# Patient Record
Sex: Female | Born: 1970 | Race: Black or African American | Hispanic: No | Marital: Married | Smoking: Never smoker
Health system: Southern US, Community
[De-identification: ages and names within clinical notes are randomized; demographics above are authoritative.]

## PROBLEM LIST (undated history)

## (undated) DIAGNOSIS — G43909 Migraine, unspecified, not intractable, without status migrainosus: Secondary | ICD-10-CM

## (undated) DIAGNOSIS — R569 Unspecified convulsions: Secondary | ICD-10-CM

## (undated) HISTORY — DX: Unspecified convulsions: R56.9

---

## 1997-07-01 HISTORY — PX: CYST EXCISION: SHX5701

## 2017-08-12 ENCOUNTER — Encounter (HOSPITAL_COMMUNITY): Payer: Self-pay | Admitting: Emergency Medicine

## 2017-08-12 ENCOUNTER — Ambulatory Visit (HOSPITAL_COMMUNITY)
Admission: EM | Admit: 2017-08-12 | Discharge: 2017-08-12 | Disposition: A | Discharge status: Home / Self Care | Payer: Medicaid Other | Attending: Family Medicine | Admitting: Family Medicine

## 2017-08-12 DIAGNOSIS — G43909 Migraine, unspecified, not intractable, without status migrainosus: Secondary | ICD-10-CM

## 2017-08-12 DIAGNOSIS — Z760 Encounter for issue of repeat prescription: Secondary | ICD-10-CM | POA: Diagnosis not present

## 2017-08-12 HISTORY — DX: Migraine, unspecified, not intractable, without status migrainosus: G43.909

## 2017-08-12 MED ORDER — LEVETIRACETAM 250 MG PO TABS: 250.0000 mg | ORAL_TABLET | Freq: Two times a day (BID) | ORAL | 0 refills | Status: DC

## 2017-08-12 NOTE — Discharge Instructions (Signed)
Keppra refilled for 40 days, follow up PCP as scheduled for further management and evaluation needed.

## 2017-08-12 NOTE — ED Triage Notes (Signed)
PT reports she takes a seizure med for her migraines. She recently moved from [place] and she is running out of this med. She has been using it sparingly and has a headache today as a result.   Keppra 250 mg BID

## 2017-08-12 NOTE — ED Provider Notes (Signed)
[facility]    CSN: [id number] Arrival date & time: 08/12/17  0957     History   Chief Complaint Chief Complaint  Patient presents with  . Headache  . Medication Refill    HPI Carol Wiggins is a 47 y.o. female.   47 year old female with history of migraines and seizures comes in for medication refill. States that she moved to [place] from [place] about 2 months ago, she has not been able to find a PCP due to transferring of insurance. She is running out of her Keppra medication, and has been taking it once a day instead of twice a day starting last week. States now with right sided headache that is dull in nature. States dull right sided headache is normal for her prior to starting Keppra, and that Keppra was able to control the headache without problems. She started Keppra 03/2017, which was when her last seizure episode was, she has not had an episode on Keppra. She now has a PCP appointment scheduled for the week of 3/15 and will await them to refer her to neurologist. She is on her last 2 pills of Keppra.       Past Medical History:  Diagnosis Date  . Migraines     There are no active problems to display for this patient.   History reviewed. No pertinent surgical history.  OB History    No data available       Home Medications    Prior to Admission medications   Medication Sig Start Date End Date Taking? Authorizing Provider  levETIRAcetam (KEPPRA) 250 MG tablet Take 1 tablet (250 mg total) by mouth 2 (two) times daily. 08/12/17 09/21/17  Belinda Fisher, PA-C    Family History No family history on file.  Social History Social History   Tobacco Use  . Smoking status: Not on file  Substance Use Topics  . Alcohol use: Not on file  . Drug use: Not on file     Allergies   Patient has no known allergies.   Review of Systems Review of Systems  Reason unable to perform ROS: See HPI as above.     Physical Exam Triage Vital Signs ED  Triage Vitals  Enc Vitals Group     BP 08/12/17 1011 123/79     Pulse Rate 08/12/17 1011 (!) 56     Resp 08/12/17 1011 16     Temp 08/12/17 1011 97.7 F (36.5 C)     Temp Source 08/12/17 1011 Oral     SpO2 08/12/17 1011 99 %     Weight 08/12/17 1010 160 lb (72.6 kg)     Height 08/12/17 1010 5\' 2"  (1.575 m)     Head Circumference --      Peak Flow --      Pain Score 08/12/17 1010 4     Pain Loc --      Pain Edu? --      Excl. in GC? --    No data found.  Updated Vital Signs BP 123/79   Pulse (!) 56   Temp 97.7 F (36.5 C) (Oral)   Resp 16   Ht 5\' 2"  (1.575 m)   Wt 160 lb (72.6 kg)   LMP 07/26/2017   SpO2 99%   BMI 29.26 kg/m   Physical Exam  Constitutional: She is oriented to person, place, and time. She appears well-developed and well-nourished. No distress.  HENT:  Head: Normocephalic and atraumatic.  Eyes:  Conjunctivae and EOM are normal. Pupils are equal, round, and reactive to light.  Neck: Normal range of motion. Neck supple.  Cardiovascular: Normal rate, regular rhythm and normal heart sounds. Exam reveals no gallop and no friction rub.  No murmur heard. Pulmonary/Chest: Effort normal and breath sounds normal. No stridor. No respiratory distress. She has no wheezes. She has no rales.  Neurological: She is alert and oriented to person, place, and time.   UC Treatments / Results  Labs (all labs ordered are listed, but only abnormal results are displayed) Labs Reviewed - No data to display  EKG  EKG Interpretation None       Radiology No results found.  Procedures Procedures (including critical care time)  Medications Ordered in UC Medications - No data to display   Initial Impression / Assessment and Plan / UC Course  I have reviewed the triage vital signs and the nursing notes.  Pertinent labs & imaging results that were available during my care of the patient were reviewed by me and considered in my medical decision making (see chart for  details).    47 year old female with history of seizures or migraines comes in for medication refill of seizure medication. [place], and has not been able to establish PCP care due to transferring insurance. Has been on Keppra 250 mg twice a day for the past 4 months without seizures. Now reduced to one a day to preserve medication. Right dull headache that is normal to patient prior to starting Keppra, and states she just needs her regular dose to resolve the headache. She has a PCP appointment a week of March 15 not that her insurance has been processed. Will refill medication for 40 days, patient to follow-up with PCP as scheduled for further evaluation and management needed.  Return precautions given. Patient expresses understanding and agrees to plan.  Final Clinical Impressions(s) / UC Diagnoses   Final diagnoses:  Medication refill    ED Discharge Orders        Ordered    levETIRAcetam (KEPPRA) 250 MG tablet  2 times daily     08/12/17 1019        Belinda FisherYu, Amy V, [place]-C 08/12/17 1046

## 2018-04-14 ENCOUNTER — Other Ambulatory Visit: Payer: Self-pay | Admitting: Family Medicine

## 2018-04-14 ENCOUNTER — Ambulatory Visit
Admission: RE | Admit: 2018-04-14 | Discharge: 2018-04-14 | Disposition: A | Discharge status: Home / Self Care | Payer: Medicaid Other | Source: Ambulatory Visit | Attending: Family Medicine | Admitting: Family Medicine

## 2018-04-14 DIAGNOSIS — Z117 Encounter for testing for latent tuberculosis infection: Secondary | ICD-10-CM

## 2018-05-07 ENCOUNTER — Other Ambulatory Visit: Payer: Self-pay | Admitting: Family Medicine

## 2018-05-07 ENCOUNTER — Other Ambulatory Visit (HOSPITAL_COMMUNITY)
Admission: RE | Admit: 2018-05-07 | Discharge: 2018-05-07 | Disposition: A | Discharge status: Home / Self Care | Payer: Medicaid Other | Source: Ambulatory Visit | Attending: Family Medicine | Admitting: Family Medicine

## 2018-05-07 DIAGNOSIS — Z01411 Encounter for gynecological examination (general) (routine) with abnormal findings: Secondary | ICD-10-CM | POA: Diagnosis not present

## 2018-05-13 LAB — CYTOLOGY - PAP
Diagnosis: NEGATIVE
HPV (WINDOPATH): NOT DETECTED

## 2018-08-12 ENCOUNTER — Other Ambulatory Visit (HOSPITAL_COMMUNITY)
Admission: RE | Admit: 2018-08-12 | Discharge: 2018-08-12 | Disposition: A | Discharge status: Home / Self Care | Payer: Medicaid Other | Source: Ambulatory Visit | Attending: Family Medicine | Admitting: Family Medicine

## 2018-08-12 ENCOUNTER — Other Ambulatory Visit: Payer: Self-pay

## 2018-08-12 DIAGNOSIS — N898 Other specified noninflammatory disorders of vagina: Secondary | ICD-10-CM | POA: Diagnosis present

## 2018-08-15 LAB — URINE CYTOLOGY ANCILLARY ONLY
CHLAMYDIA, DNA PROBE: NEGATIVE
Candida vaginitis: NEGATIVE
Neisseria Gonorrhea: NEGATIVE
TRICH (WINDOWPATH): NEGATIVE

## 2018-11-30 ENCOUNTER — Telehealth: Payer: Self-pay | Admitting: Diagnostic Neuroimaging

## 2018-11-30 NOTE — Telephone Encounter (Signed)
°  Due to current COVID 19 pandemic, our office is severely reducing in office visits until further notice, in order to minimize the risk to our patients and healthcare providers.    Called patient and scheduled a virtual visit with Dr. Marjory Lies for 6/10. Patient verbalized understanding of the [email or web address] process and I have sent an e-mail to [email or web address] with link and instructions as well as my name and our office number. Patient understands that they will receive a call from RN to update chart.   Pt understands that although there may be some limitations with this type of visit, we will take all precautions to reduce any security or privacy concerns.  Pt understands that this will be treated like an in office visit and we will file with pt's insurance, and there may be a patient responsible charge related to this service.

## 2018-12-08 ENCOUNTER — Encounter: Payer: Self-pay | Admitting: Diagnostic Neuroimaging

## 2018-12-08 NOTE — Telephone Encounter (Signed)
Called patient and LVM requesting call back to update EMR.  

## 2018-12-09 ENCOUNTER — Ambulatory Visit (INDEPENDENT_AMBULATORY_CARE_PROVIDER_SITE_OTHER): Payer: Medicaid Other | Admitting: Diagnostic Neuroimaging

## 2018-12-09 ENCOUNTER — Other Ambulatory Visit: Payer: Self-pay

## 2018-12-09 ENCOUNTER — Encounter: Payer: Self-pay | Admitting: Diagnostic Neuroimaging

## 2018-12-09 DIAGNOSIS — G40209 Localization-related (focal) (partial) symptomatic epilepsy and epileptic syndromes with complex partial seizures, not intractable, without status epilepticus: Secondary | ICD-10-CM | POA: Diagnosis not present

## 2018-12-09 DIAGNOSIS — G43109 Migraine with aura, not intractable, without status migrainosus: Secondary | ICD-10-CM | POA: Diagnosis not present

## 2018-12-09 MED ORDER — TOPIRAMATE 50 MG PO TABS: 50.0000 mg | ORAL_TABLET | Freq: Two times a day (BID) | ORAL | 12 refills | Status: DC

## 2018-12-09 MED ORDER — LEVETIRACETAM 250 MG PO TABS: 250.0000 mg | ORAL_TABLET | Freq: Two times a day (BID) | ORAL | 4 refills | Status: DC

## 2018-12-09 MED ORDER — RIZATRIPTAN BENZOATE 10 MG PO TBDP: 10.0000 mg | ORAL_TABLET | ORAL | 11 refills | Status: DC | PRN

## 2018-12-09 NOTE — Progress Notes (Signed)
[facility]  PATIENT: Carol Wiggins DOB: 1971/01/12  REFERRING CLINICIAN: Clayburn Pert HISTORY FROM: patient  REASON FOR VISIT: new consult    HISTORICAL  CHIEF COMPLAINT:  Chief Complaint  Patient presents with  . Seizures  . Headache    HISTORY OF PRESENT ILLNESS:   48 year old female here for evaluation of seizures and headaches.  Patient was living in [place] in 2018 and had new onset of zoning out spell while driving.  She was able to pull the car over.  No convulsions or tongue biting.  She had several of these episodes and went to neurology for evaluation.  MRI of the brain was unremarkable.  Ambulatory EEG apparently showed an abnormality and therefore patient was started on levetiracetam 200 mg twice a day.  Since that time no further events.  Patient also is had intermittent headaches since 2009.  She describes frontal throbbing severe headaches with nausea sensitivity to light and sound.  Now headaches have worsened in the last few weeks.  Patient has been more stress lately.  She is tried over-the-counter Excedrin Migraine with mild relief.  She not tried migraine prevention or rescue medications by prescription yet.   REVIEW OF SYSTEMS: Full 14 system review of systems performed and negative with exception of: As per HPI.  ALLERGIES: No Known Allergies  HOME MEDICATIONS: Outpatient Medications Prior to Visit  Medication Sig Dispense Refill  . levETIRAcetam (KEPPRA) 250 MG tablet Take 1 tablet (250 mg total) by mouth 2 (two) times daily. 80 tablet 0   No facility-administered medications prior to visit.     PAST MEDICAL HISTORY: Past Medical History:  Diagnosis Date  . Migraines   . Seizures ([place])     PAST SURGICAL HISTORY: No past surgical history on file.  FAMILY HISTORY: No family history on file.  SOCIAL HISTORY: Social History   Socioeconomic History  . Marital status: Married    Spouse name: Not on file  . Number of  children: Not on file  . Years of education: Not on file  . Highest education level: Not on file  Occupational History  . Not on file  Social Needs  . Financial resource strain: Not on file  . Food insecurity:    Worry: Not on file    Inability: Not on file  . Transportation needs:    Medical: Not on file    Non-medical: Not on file  Tobacco Use  . Smoking status: Not on file  Substance and Sexual Activity  . Alcohol use: Not on file  . Drug use: Not on file  . Sexual activity: Not on file  Lifestyle  . Physical activity:    Days per week: Not on file    Minutes per session: Not on file  . Stress: Not on file  Relationships  . Social connections:    Talks on phone: Not on file    Gets together: Not on file    Attends religious service: Not on file    Active member of club or organization: Not on file    Attends meetings of clubs or organizations: Not on file    Relationship status: Not on file  . Intimate partner violence:    Fear of current or ex partner: Not on file    Emotionally abused: Not on file    Physically abused: Not on file    Forced sexual activity: Not on file  Other Topics Concern  . Not on file  Social History Narrative  .  Not on file     PHYSICAL EXAM   VIDEO EXAM  GENERAL EXAM/CONSTITUTIONAL:  Vitals: There were no vitals filed for this visit.  There is no height or weight on file to calculate BMI. Wt Readings from Last 3 Encounters:  08/12/17 160 lb (72.6 kg)     Patient is in no distress; well developed, nourished and groomed; neck is supple   NEUROLOGIC: MENTAL STATUS:  No flowsheet data found.  awake, alert, oriented to person, place and time  recent and remote memory intact  normal attention and concentration  language fluent, comprehension intact, naming intact  fund of knowledge appropriate  CRANIAL NERVE:   2nd, 3rd, 4th, 6th - visual fields full to confrontation, extraocular muscles intact, no nystagmus  5th -  facial sensation symmetric  7th - facial strength symmetric  8th - hearing intact  11th - shoulder shrug symmetric  12th - tongue protrusion midline  MOTOR:   NO TREMOR; NO DRIFT IN BUE  SENSORY:   normal and symmetric to light touch  COORDINATION:   fine finger movements normal     DIAGNOSTIC DATA (LABS, IMAGING, TESTING) - I reviewed patient records, labs, notes, testing and imaging myself where available.  No results found for: WBC, HGB, HCT, MCV, PLT No results found for: NA, K, CL, CO2, GLUCOSE, BUN, CREATININE, CALCIUM, PROT, ALBUMIN, AST, ALT, ALKPHOS, BILITOT, GFRNONAA, GFRAA No results found for: CHOL, HDL, LDLCALC, LDLDIRECT, TRIG, CHOLHDL No results found for: [id number] No results found for: VITAMINB12 No results found for: TSH   MRI brain --> normal per patient  EEG --> "epilepsy; 60%" per patient    ASSESSMENT AND PLAN  48 y.o. year old female here with complex partial seizures and migraine with aura.   Dx:  1. Partial symptomatic epilepsy with complex partial seizures, not intractable, without status epilepticus (HCC)   2. Migraine with aura and without status migrainosus, not intractable     Virtual Visit via Video Note  I connected with Carol Wiggins on 12/09/18 at  2:30 PM EDT by a video enabled telemedicine application and verified that I am speaking with the correct person using two identifiers.  Location: Patient: home Provider: office   I discussed the limitations of evaluation and management by telemedicine and the availability of in person appointments. The patient expressed understanding and agreed to proceed.  I discussed the assessment and treatment plan with the patient. The patient was provided an opportunity to ask questions and all were answered. The patient agreed with the plan and demonstrated an understanding of the instructions.   The patient was advised to call back or seek an in-person evaluation if the symptoms  worsen or if the condition fails to improve as anticipated.  I provided 30 minutes of non-face-to-face time during this encounter.    PLAN:  SEIZURE DISORDER (complex partial sz; zone out spells; last seizure in 2018) - continue levetiracetam 250mg  twice a day    MIGRAINE WITH AURA - MIGRAINE PREVENTION --> start topiramate 50mg  at bedtime; after 1 week increase to twice a day; drink plenty of water  - MIGRAINE RESCUE --> rizatriptan 10mg  as needed for breakthrough headache; may repeat x 1 after 2 hours; max 2 tabs per day or 8 per month  Meds ordered this encounter  Medications  . topiramate (TOPAMAX) 50 MG tablet    Sig: Take 1 tablet (50 mg total) by mouth 2 (two) times daily.    Dispense:  60 tablet    Refill:  12  . rizatriptan (MAXALT-MLT) 10 MG disintegrating tablet    Sig: Take 1 tablet (10 mg total) by mouth as needed for migraine. May repeat in 2 hours if needed    Dispense:  9 tablet    Refill:  11  . levETIRAcetam (KEPPRA) 250 MG tablet    Sig: Take 1 tablet (250 mg total) by mouth 2 (two) times daily.    Dispense:  180 tablet    Refill:  4   Return in about 3 months (around 03/11/2019).    Suanne MarkerVIKRAM R. Issiac Jamar, MD 12/09/2018, 3:00 PM Certified in Neurology, Neurophysiology and Neuroimaging  [facility] [address], [address] [place], [place] [address] [phone]

## 2019-02-26 IMAGING — DX DG CHEST 2V
2 series · 2 of 2 positions shown · non-contrast
Comparison: None.

CLINICAL DATA: History of latent TB.

EXAM:
CHEST - 2 VIEW

[dg chest 2 view (1 of 2)]
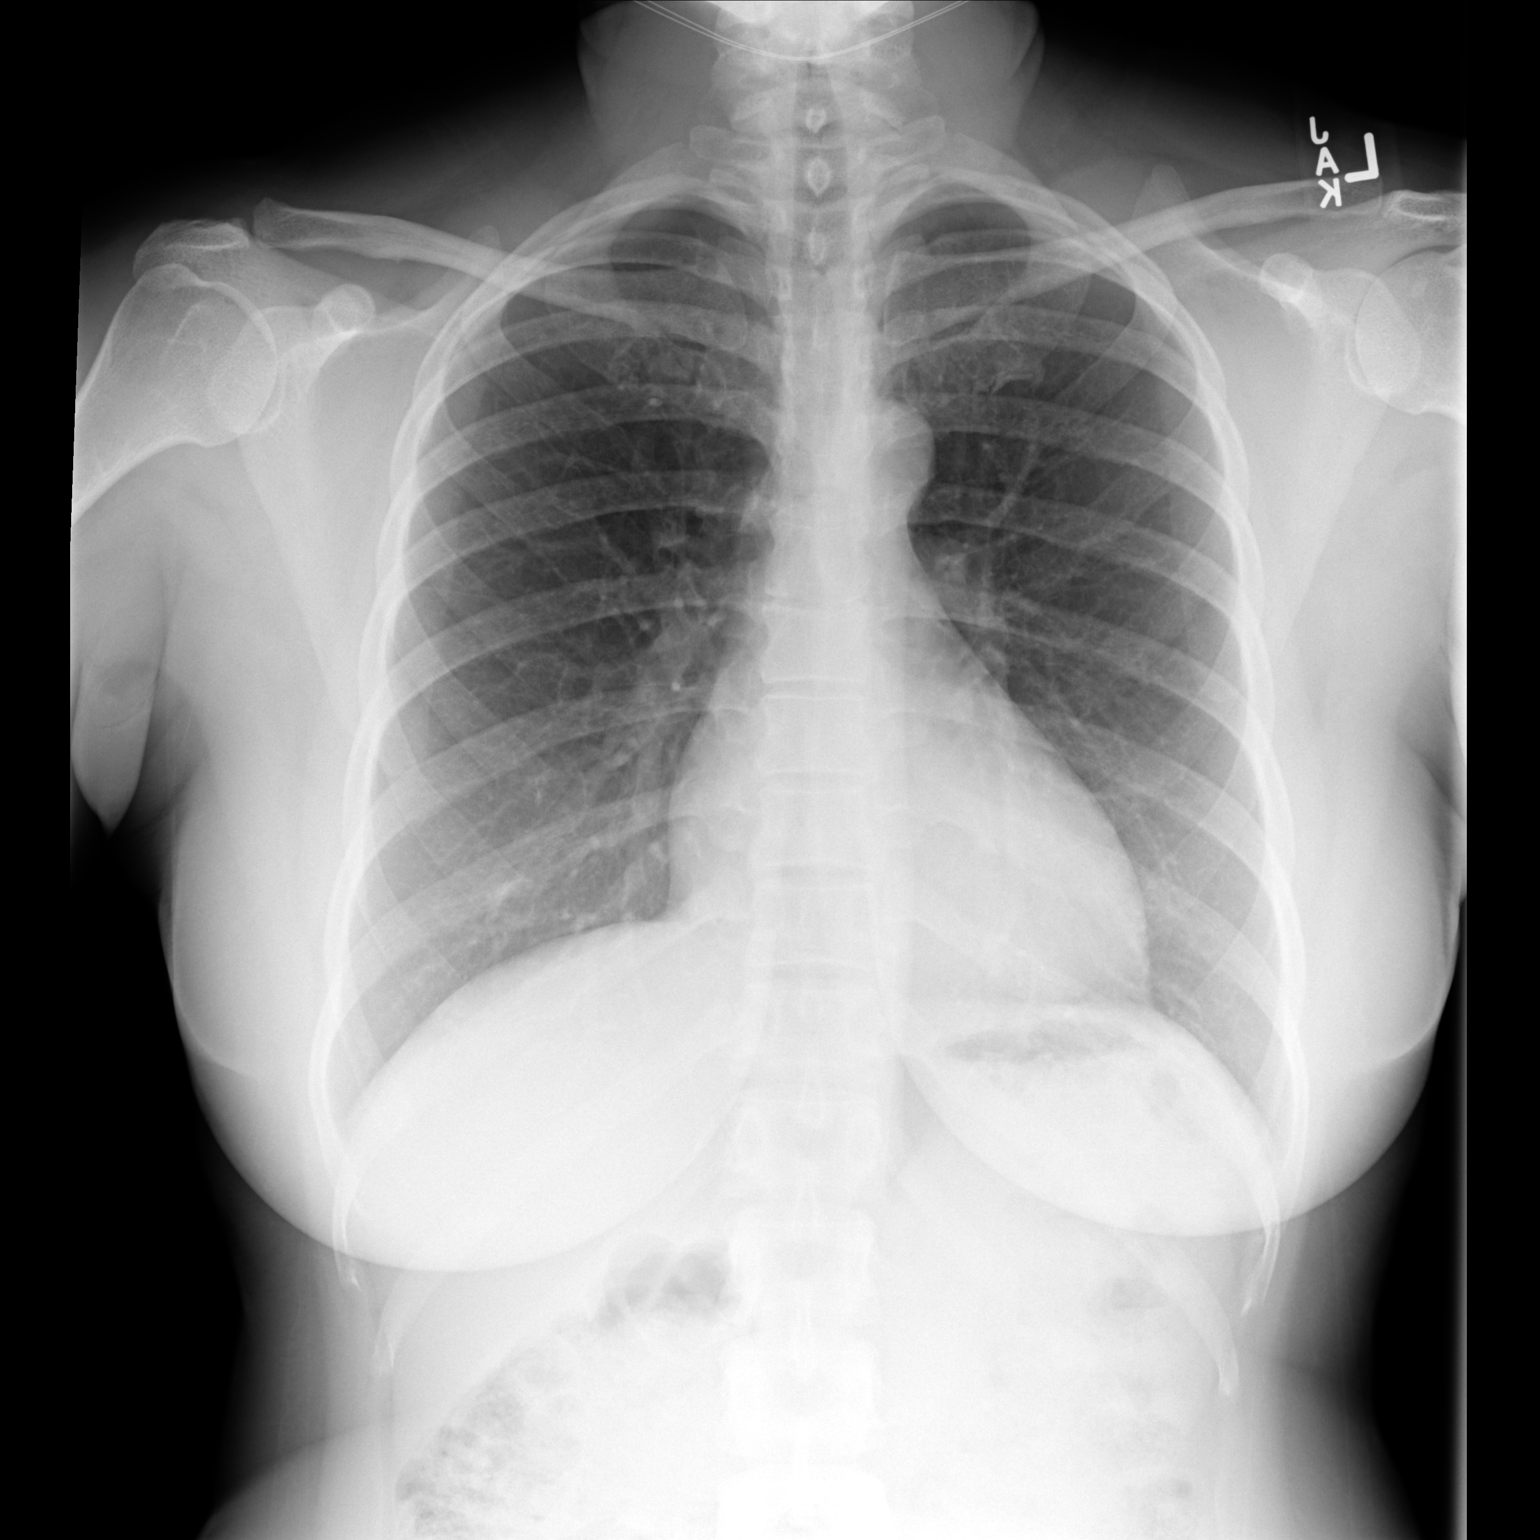

[dg chest 2 view (2 of 2)]
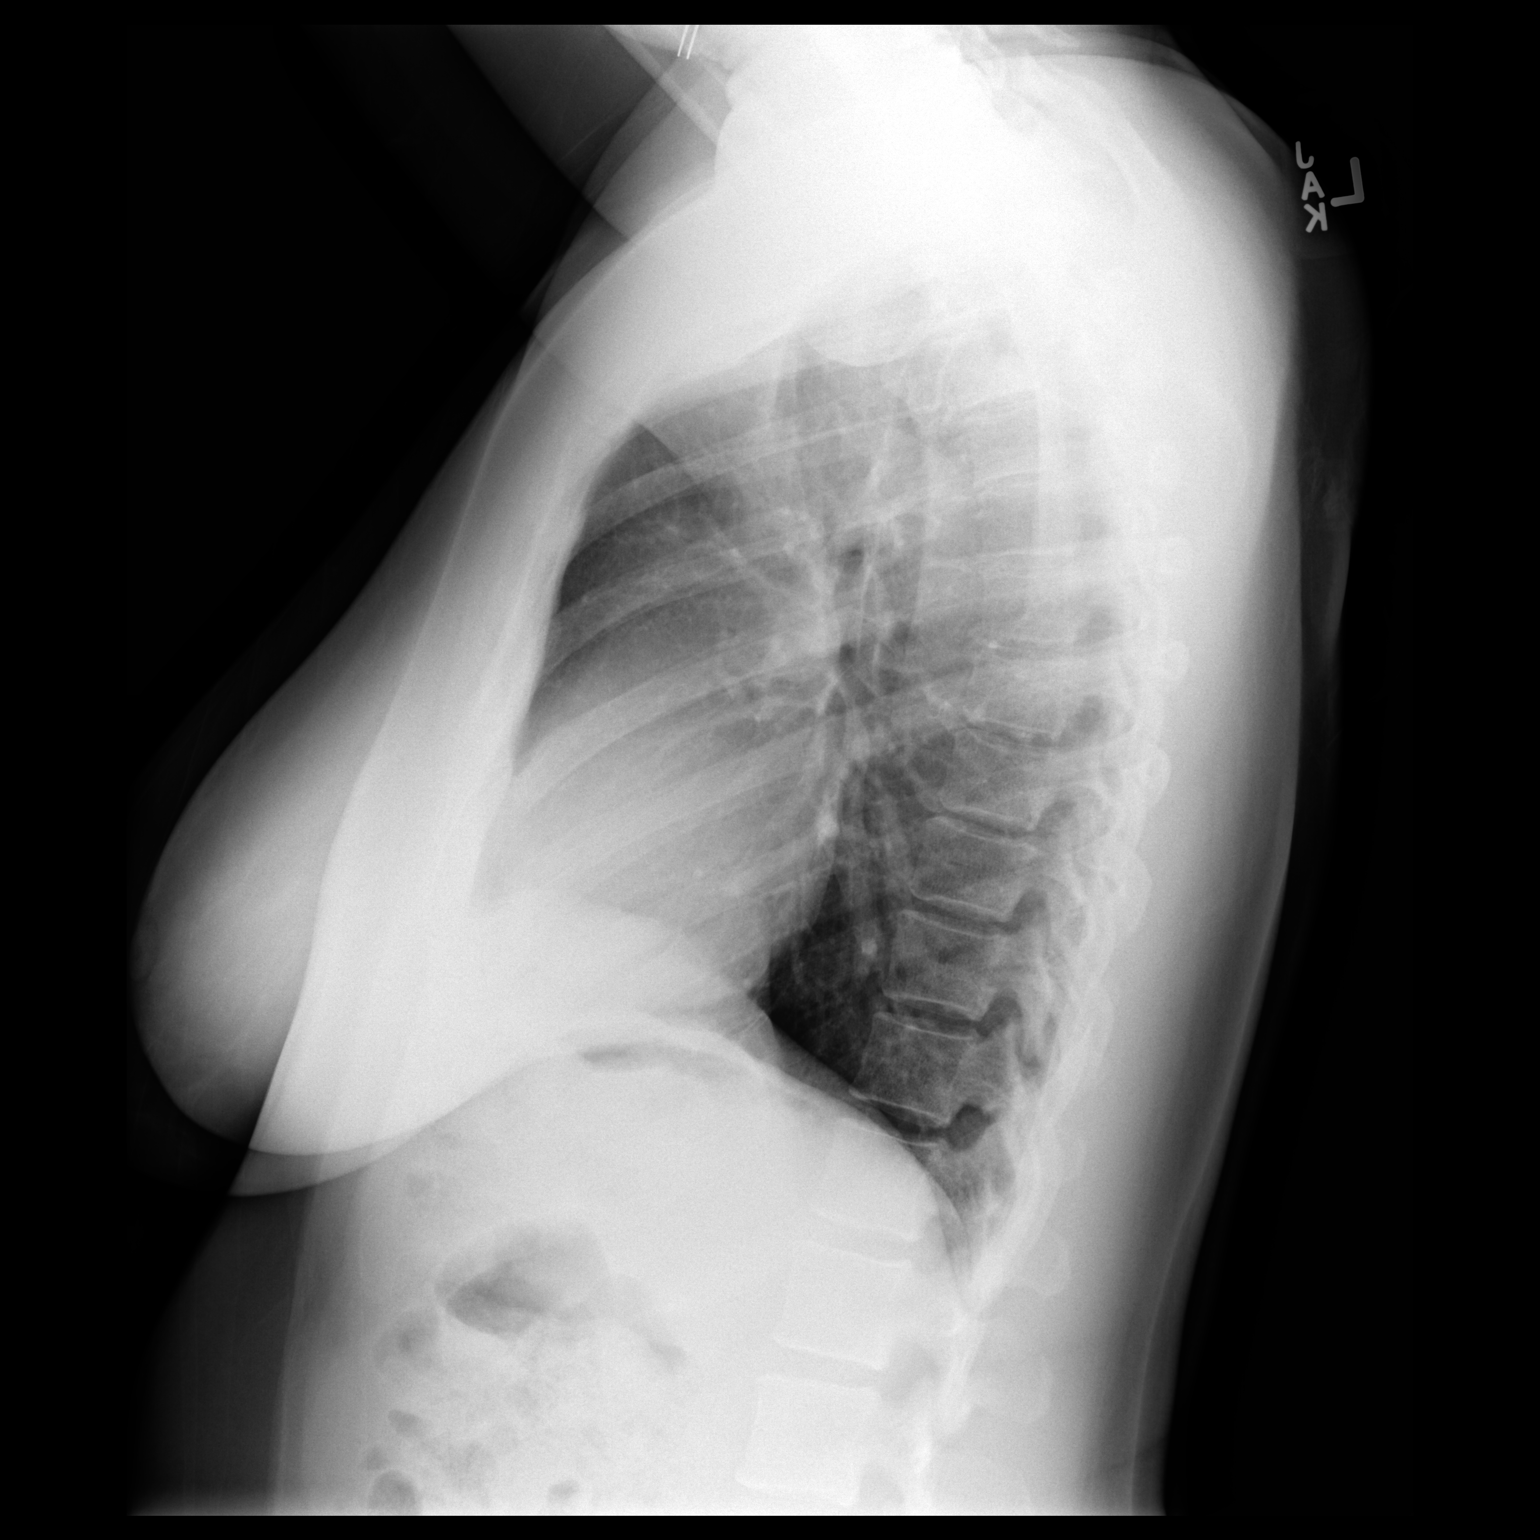

[2 of 2 positions shown; findings below may reference images not displayed]

FINDINGS: The heart size and mediastinal contours are within normal limits.
Both lungs are clear. The visualized skeletal structures are
unremarkable.
IMPRESSION: No active cardiopulmonary disease.

## 2019-04-09 ENCOUNTER — Other Ambulatory Visit: Payer: Self-pay

## 2019-04-09 DIAGNOSIS — Z20822 Contact with and (suspected) exposure to covid-19: Secondary | ICD-10-CM

## 2019-04-10 LAB — NOVEL CORONAVIRUS, NAA: SARS-CoV-2, NAA: NOT DETECTED

## 2019-04-20 ENCOUNTER — Telehealth: Payer: Self-pay | Admitting: Diagnostic Neuroimaging

## 2019-04-20 ENCOUNTER — Telehealth: Payer: Self-pay | Admitting: General Practice

## 2019-04-20 NOTE — Telephone Encounter (Signed)
Pt is needing a refill on her levETIRAcetam (KEPPRA) 250 MG tablet, her rizatriptan (MAXALT-MLT) 10 MG disintegrating tablet and her topiramate (TOPAMAX) 50 MG tablet sent to [facility]

## 2019-04-20 NOTE — Telephone Encounter (Signed)
Called patient and advised her Dr Leta Baptist gave refills for all three medicines. I advised she needs follow up as well. She stated pharmacy told her there were no refills, and she stated she needs FU in Oct because she'll be out of town the whole month of Nov. We scheduled FU, and I advised will [facility] and get [facility]ills straight. She  verbalized understanding, appreciation. I called [facility], spoke with Ailene Ravel who confirmed they have refills on file. She will fill and notify patient.

## 2019-04-20 NOTE — Telephone Encounter (Signed)
Negative COVID results given. Patient results "NOT Detected." Caller expressed understanding. ° °

## 2019-04-26 ENCOUNTER — Encounter: Payer: Self-pay | Admitting: Diagnostic Neuroimaging

## 2019-04-26 ENCOUNTER — Ambulatory Visit: Payer: Medicaid Other | Admitting: Diagnostic Neuroimaging

## 2019-04-26 ENCOUNTER — Other Ambulatory Visit: Payer: Self-pay

## 2019-04-26 VITALS — BP 131/91 | HR 77 | Temp 97.3°F | Ht 62.0 in | Wt 167.0 lb

## 2019-04-26 DIAGNOSIS — G43109 Migraine with aura, not intractable, without status migrainosus: Secondary | ICD-10-CM

## 2019-04-26 DIAGNOSIS — G40209 Localization-related (focal) (partial) symptomatic epilepsy and epileptic syndromes with complex partial seizures, not intractable, without status epilepticus: Secondary | ICD-10-CM

## 2019-04-26 MED ORDER — RIZATRIPTAN BENZOATE 10 MG PO TBDP: 10.0000 mg | ORAL_TABLET | ORAL | 11 refills | Status: AC | PRN

## 2019-04-26 MED ORDER — LEVETIRACETAM 250 MG PO TABS: 250.0000 mg | ORAL_TABLET | Freq: Two times a day (BID) | ORAL | 4 refills | Status: AC

## 2019-04-26 MED ORDER — TOPIRAMATE 50 MG PO TABS: 50.0000 mg | ORAL_TABLET | Freq: Two times a day (BID) | ORAL | 4 refills | Status: AC

## 2019-04-26 NOTE — Progress Notes (Signed)
[facility]  PATIENT: Carol Wiggins DOB: [birth year]  REFERRING CLINICIAN: Lowella Bandy HISTORY FROM: patient  REASON FOR VISIT: follow up    HISTORICAL  CHIEF COMPLAINT:  Chief Complaint  Patient presents with  . Epilepsy    rm 7, 3 month FU, "no seizures; headaches not as bad as they used to be"    HISTORY OF PRESENT ILLNESS:   UPDATE (04/26/19, VRP): Since last visit, doing well. Symptoms are stable. No alleviating or aggravating factors. Tolerating meds. No seizures. Having HA 1 per week (for some reason did not refill the HA meds).   PRIOR HPI (12/09/18): 48 year old female here for evaluation of seizures and headaches.  Patient was living in [place] in 2018 and had new onset of zoning out spell while driving.  She was able to pull the car over.  No convulsions or tongue biting.  She had several of these episodes and went to neurology for evaluation.  MRI of the brain was unremarkable.  Ambulatory EEG apparently showed an abnormality and therefore patient was started on levetiracetam 200 mg twice a day.  Since that time no further events.  Patient also is had intermittent headaches since 2009.  She describes frontal throbbing severe headaches with nausea sensitivity to light and sound.  Now headaches have worsened in the last few weeks.  Patient has been more stress lately.  She is tried over-the-counter Excedrin Migraine with mild relief.  She not tried migraine prevention or rescue medications by prescription yet.   REVIEW OF SYSTEMS: Full 14 system review of systems performed and negative with exception of: as per HPI.   ALLERGIES: No Known Allergies  HOME MEDICATIONS: Outpatient Medications Prior to Visit  Medication Sig Dispense Refill  . levETIRAcetam (KEPPRA) 250 MG tablet Take 1 tablet (250 mg total) by mouth 2 (two) times daily. 180 tablet 4  . rizatriptan (MAXALT-MLT) 10 MG disintegrating tablet Take 1 tablet (10 mg total) by mouth as needed  for migraine. May repeat in 2 hours if needed 9 tablet 11  . topiramate (TOPAMAX) 50 MG tablet Take 1 tablet (50 mg total) by mouth 2 (two) times daily. 60 tablet 12   No facility-administered medications prior to visit.     PAST MEDICAL HISTORY: Past Medical History:  Diagnosis Date  . Migraines   . Seizures (HCC)     PAST SURGICAL HISTORY: Past Surgical History:  Procedure Laterality Date  . CESAREAN SECTION  2009  . CYST EXCISION  1999   ovarian    FAMILY HISTORY: No family history on file.  SOCIAL HISTORY: Social History   Socioeconomic History  . Marital status: Married    Spouse name: Not on file  . Number of children: 2  . Years of education: Not on file  . Highest education level: Not on file  Occupational History    Comment: weekends only, home care  Social Needs  . Financial resource strain: Not on file  . Food insecurity    Worry: Not on file    Inability: Not on file  . Transportation needs    Medical: Not on file    Non-medical: Not on file  Tobacco Use  . Smoking status: Never Smoker  . Smokeless tobacco: Never Used  Substance and Sexual Activity  . Alcohol use: Yes    Comment: rarely  . Drug use: Never  . Sexual activity: Not on file  Lifestyle  . Physical activity    Days per week: Not on file  Minutes per session: Not on file  . Stress: Not on file  Relationships  . Social Herbalist on phone: Not on file    Gets together: Not on file    Attends religious service: Not on file    Active member of club or organization: Not on file    Attends meetings of clubs or organizations: Not on file    Relationship status: Not on file  . Intimate partner violence    Fear of current or ex partner: Not on file    Emotionally abused: Not on file    Physically abused: Not on file    Forced sexual activity: Not on file  Other Topics Concern  . Not on file  Social History Narrative   From [place], Lives with family     PHYSICAL EXAM   GENERAL EXAM/CONSTITUTIONAL: Vitals:  Vitals:   04/26/19 1312  BP: (!) 131/91  Pulse: 77  Temp: (!) 97.3 F (36.3 C)  Weight: 167 lb (75.8 kg)  Height: 5\' 2"  (1.575 m)     Body mass index is 30.54 kg/m. Wt Readings from Last 3 Encounters:  04/26/19 167 lb (75.8 kg)  08/12/17 160 lb (72.6 kg)     Patient is in no distress; well developed, nourished and groomed; neck is supple  CARDIOVASCULAR:  Examination of carotid arteries is normal; no carotid bruits  Regular rate and rhythm, no murmurs  Examination of peripheral vascular system by observation and palpation is normal  EYES:  Ophthalmoscopic exam of optic discs and posterior segments is normal; no papilledema or hemorrhages  No exam data present  MUSCULOSKELETAL:  Gait, strength, tone, movements noted in Neurologic exam below  NEUROLOGIC: MENTAL STATUS:  No flowsheet data found.  awake, alert, oriented to person, place and time  recent and remote memory intact  normal attention and concentration  language fluent, comprehension intact, naming intact  fund of knowledge appropriate  CRANIAL NERVE:   2nd - no papilledema on fundoscopic exam  2nd, 3rd, 4th, 6th - pupils equal and reactive to light, visual fields full to confrontation, extraocular muscles intact, no nystagmus  5th - facial sensation symmetric  7th - facial strength symmetric  8th - hearing intact  9th - palate elevates symmetrically, uvula midline  11th - shoulder shrug symmetric  12th - tongue protrusion midline  MOTOR:   normal bulk and tone, full strength in the BUE, BLE  SENSORY:   normal and symmetric to light touch, pinprick, temperature, vibration  COORDINATION:   finger-nose-finger, fine finger movements normal  REFLEXES:   deep tendon reflexes present and symmetric  GAIT/STATION:   narrow based gait; able to walk on toes, heels and tandem; romberg is negative     DIAGNOSTIC DATA (LABS, IMAGING,  TESTING) - I reviewed patient records, labs, notes, testing and imaging myself where available.  No results found for: WBC, HGB, HCT, MCV, PLT No results found for: NA, K, CL, CO2, GLUCOSE, BUN, CREATININE, CALCIUM, PROT, ALBUMIN, AST, ALT, ALKPHOS, BILITOT, GFRNONAA, GFRAA No results found for: CHOL, HDL, LDLCALC, LDLDIRECT, TRIG, CHOLHDL No results found for: HGBA1C No results found for: VITAMINB12 No results found for: TSH   MRI brain --> normal per patient  EEG --> "epilepsy; 60%" per patient    ASSESSMENT AND PLAN  48 y.o. year old female here with complex partial seizures and migraine with aura.   Dx:  1. Partial symptomatic epilepsy with complex partial seizures, not intractable, without status epilepticus ([place])  2. Migraine with aura and without status migrainosus, not intractable     PLAN:  SEIZURE DISORDER (complex partial sz; zone out spells; last seizure in 2018) - continue levetiracetam 250mg  twice a day   MIGRAINE WITH AURA - MIGRAINE PREVENTION --> continue topiramate 50mg  twice a day; drink plenty of water  - MIGRAINE RESCUE --> rizatriptan 10mg  as needed for breakthrough headache; may repeat x 1 after 2 hours; max 2 tabs per day or 8 per month  Meds ordered this encounter  Medications  . topiramate (TOPAMAX) 50 MG tablet    Sig: Take 1 tablet (50 mg total) by mouth 2 (two) times daily.    Dispense:  180 tablet    Refill:  4  . rizatriptan (MAXALT-MLT) 10 MG disintegrating tablet    Sig: Take 1 tablet (10 mg total) by mouth as needed for migraine. May repeat in 2 hours if needed    Dispense:  9 tablet    Refill:  11  . levETIRAcetam (KEPPRA) 250 MG tablet    Sig: Take 1 tablet (250 mg total) by mouth 2 (two) times daily.    Dispense:  180 tablet    Refill:  4   Return in about 6 months (around 10/25/2019) for with NP (Amy Lomax).    Suanne MarkerVIKRAM R. PENUMALLI, MD 04/26/2019, 1:40 PM Certified in Neurology, Neurophysiology and Neuroimaging  [facility] [address], [address] [place], [place] [address] [phone]

## 2019-04-26 NOTE — Patient Instructions (Signed)
SEIZURE DISORDER (complex partial sz; zone out spells; last seizure in 2018) - continue levetiracetam 250mg  twice a day   MIGRAINE WITH AURA - MIGRAINE PREVENTION --> continue topiramate 50mg  twice a day; drink plenty of water  - MIGRAINE RESCUE --> rizatriptan 10mg  as needed for breakthrough headache; may repeat x 1 after 2 hours; max 2 tabs per day or 8 per month

## 2019-04-27 ENCOUNTER — Telehealth: Payer: Self-pay | Admitting: *Deleted

## 2019-04-27 NOTE — Telephone Encounter (Signed)
Started PA for rizatriptan with [facility] [employer]. On Dr [employer] desk for signature.

## 2019-04-28 NOTE — Telephone Encounter (Signed)
[place] [employer] Triptan PA form for Rizatriptan completed, signed and faxed to [facility] [employer]. Check [place] [employer] web site in 24 hours for decision.

## 2019-05-05 NOTE — Telephone Encounter (Addendum)
Per [facility] site, rizatriptan denied. Called patient and advised her. She stated she wasn't concerned and that if she needed it she would pay out of pocket. I advised she please call back if she had any issues or wanted this to be pursued.  She  verbalized understanding, appreciation. GoodRx.com shows price of $9.85 for 9 tabs.

## 2019-05-06 NOTE — Telephone Encounter (Signed)
Received letter from [facility] stating that the patient did not meet the following: dx migraine or cluster headache, > 6 moderate or severe headache days/month, and tried/failed NSAIDS w/in last year or currently using NSAIDS unless contraindicated. Must concurrently use migraine preventative unless contraindicated, adverse effects occurred or no clinical benefit occurred after at least 90 day trial at max tolerated dose.
